# Patient Record
Sex: Female | Born: 1950 | Race: White | Hispanic: No | Marital: Married | Smoking: Never smoker
Health system: Southern US, Community
[De-identification: ages and names within clinical notes are randomized; demographics above are authoritative.]

## PROBLEM LIST (undated history)

## (undated) DIAGNOSIS — H18599 Other hereditary corneal dystrophies, unspecified eye: Secondary | ICD-10-CM

## (undated) DIAGNOSIS — R109 Unspecified abdominal pain: Secondary | ICD-10-CM

## (undated) DIAGNOSIS — M797 Fibromyalgia: Secondary | ICD-10-CM

## (undated) DIAGNOSIS — I10 Essential (primary) hypertension: Secondary | ICD-10-CM

## (undated) DIAGNOSIS — H1859 Other hereditary corneal dystrophies: Secondary | ICD-10-CM

## (undated) DIAGNOSIS — K219 Gastro-esophageal reflux disease without esophagitis: Secondary | ICD-10-CM

## (undated) HISTORY — PX: CHOLECYSTECTOMY: SHX55

---

## 2007-08-04 ENCOUNTER — Encounter: Admission: RE | Admit: 2007-08-04 | Discharge: 2007-08-04 | Payer: Self-pay | Admitting: Family Medicine

## 2008-08-04 ENCOUNTER — Ambulatory Visit (HOSPITAL_BASED_OUTPATIENT_CLINIC_OR_DEPARTMENT_OTHER): Admission: RE | Admit: 2008-08-04 | Discharge: 2008-08-04 | Payer: Self-pay | Admitting: Internal Medicine

## 2009-08-05 ENCOUNTER — Ambulatory Visit: Payer: Self-pay | Admitting: Diagnostic Radiology

## 2009-08-05 ENCOUNTER — Ambulatory Visit (HOSPITAL_BASED_OUTPATIENT_CLINIC_OR_DEPARTMENT_OTHER): Admission: RE | Admit: 2009-08-05 | Discharge: 2009-08-05 | Payer: Self-pay | Admitting: Family Medicine

## 2010-09-13 ENCOUNTER — Ambulatory Visit (HOSPITAL_BASED_OUTPATIENT_CLINIC_OR_DEPARTMENT_OTHER)
Admission: RE | Admit: 2010-09-13 | Discharge: 2010-09-13 | Payer: Self-pay | Source: Home / Self Care | Attending: Family Medicine | Admitting: Family Medicine

## 2011-08-09 ENCOUNTER — Other Ambulatory Visit (HOSPITAL_BASED_OUTPATIENT_CLINIC_OR_DEPARTMENT_OTHER): Payer: Self-pay | Admitting: *Deleted

## 2011-08-09 ENCOUNTER — Other Ambulatory Visit (HOSPITAL_BASED_OUTPATIENT_CLINIC_OR_DEPARTMENT_OTHER): Payer: Self-pay | Admitting: Internal Medicine

## 2011-08-09 DIAGNOSIS — Z1231 Encounter for screening mammogram for malignant neoplasm of breast: Secondary | ICD-10-CM

## 2011-09-19 ENCOUNTER — Ambulatory Visit (HOSPITAL_BASED_OUTPATIENT_CLINIC_OR_DEPARTMENT_OTHER)
Admission: RE | Admit: 2011-09-19 | Discharge: 2011-09-19 | Disposition: A | Discharge status: Home / Self Care | Payer: BC Managed Care – PPO | Source: Ambulatory Visit | Attending: Internal Medicine | Admitting: Internal Medicine

## 2011-09-19 ENCOUNTER — Ambulatory Visit (HOSPITAL_BASED_OUTPATIENT_CLINIC_OR_DEPARTMENT_OTHER): Payer: Self-pay

## 2011-09-19 DIAGNOSIS — Z1231 Encounter for screening mammogram for malignant neoplasm of breast: Secondary | ICD-10-CM | POA: Insufficient documentation

## 2012-07-19 ENCOUNTER — Emergency Department (HOSPITAL_BASED_OUTPATIENT_CLINIC_OR_DEPARTMENT_OTHER): Payer: Managed Care, Other (non HMO)

## 2012-07-19 ENCOUNTER — Emergency Department (HOSPITAL_BASED_OUTPATIENT_CLINIC_OR_DEPARTMENT_OTHER)
Admission: EM | Admit: 2012-07-19 | Discharge: 2012-07-19 | Disposition: A | Discharge status: Home / Self Care | Payer: Managed Care, Other (non HMO) | Attending: Emergency Medicine | Admitting: Emergency Medicine

## 2012-07-19 ENCOUNTER — Encounter (HOSPITAL_BASED_OUTPATIENT_CLINIC_OR_DEPARTMENT_OTHER): Payer: Self-pay | Admitting: *Deleted

## 2012-07-19 DIAGNOSIS — M542 Cervicalgia: Secondary | ICD-10-CM | POA: Insufficient documentation

## 2012-07-19 DIAGNOSIS — W19XXXA Unspecified fall, initial encounter: Secondary | ICD-10-CM

## 2012-07-19 DIAGNOSIS — I10 Essential (primary) hypertension: Secondary | ICD-10-CM | POA: Insufficient documentation

## 2012-07-19 DIAGNOSIS — R110 Nausea: Secondary | ICD-10-CM | POA: Insufficient documentation

## 2012-07-19 DIAGNOSIS — W010XXA Fall on same level from slipping, tripping and stumbling without subsequent striking against object, initial encounter: Secondary | ICD-10-CM | POA: Insufficient documentation

## 2012-07-19 DIAGNOSIS — T1490XA Injury, unspecified, initial encounter: Secondary | ICD-10-CM | POA: Insufficient documentation

## 2012-07-19 DIAGNOSIS — R209 Unspecified disturbances of skin sensation: Secondary | ICD-10-CM | POA: Insufficient documentation

## 2012-07-19 DIAGNOSIS — M2550 Pain in unspecified joint: Secondary | ICD-10-CM | POA: Insufficient documentation

## 2012-07-19 HISTORY — DX: Essential (primary) hypertension: I10

## 2012-07-19 IMAGING — CR DG TIBIA/FIBULA 2V*R*
4 series · 4 of 4 positions shown · non-contrast
Comparison: None.

CLINICAL DATA: Fall

RIGHT TIBIA AND FIBULA - 2 VIEW

[t tib/fib ap right (1 of 2)]
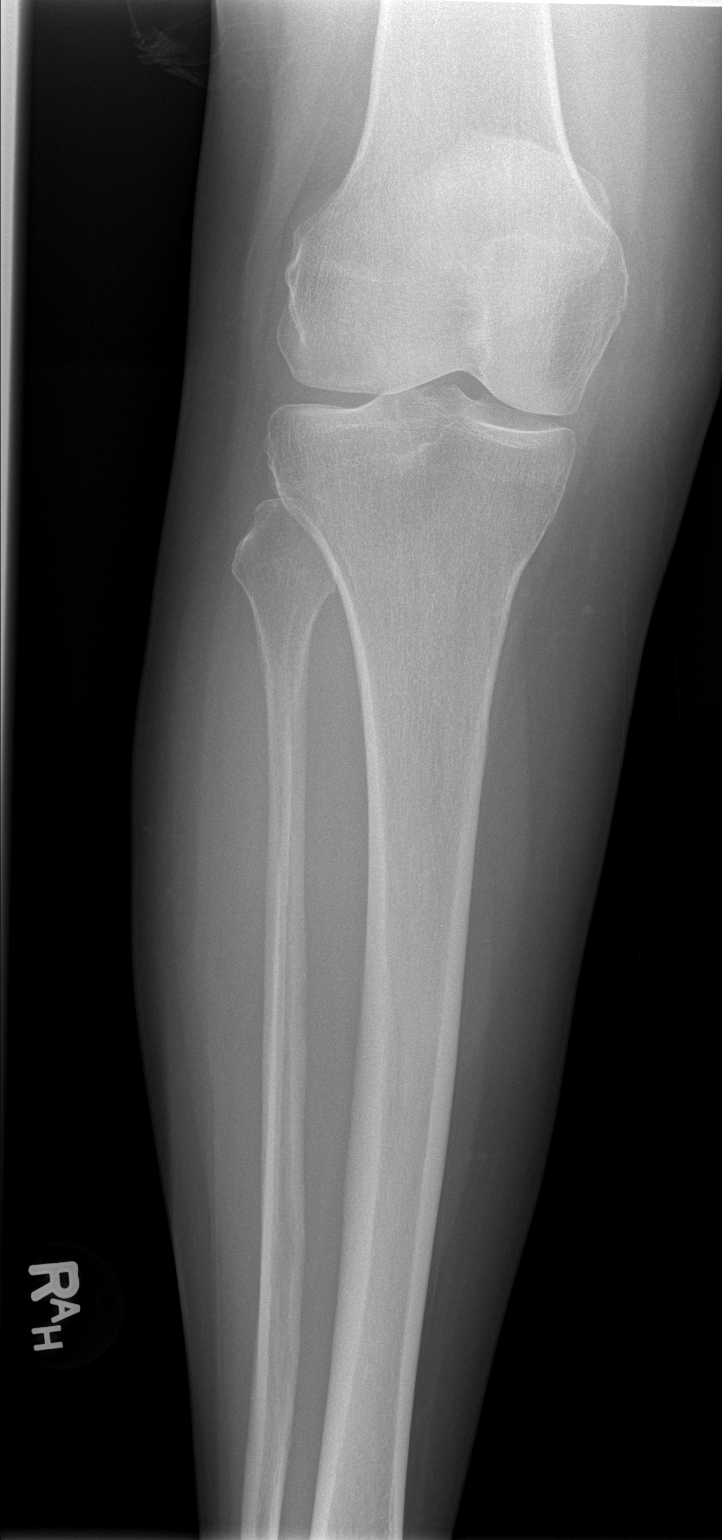

[t tib/fib ap right (2 of 2)]
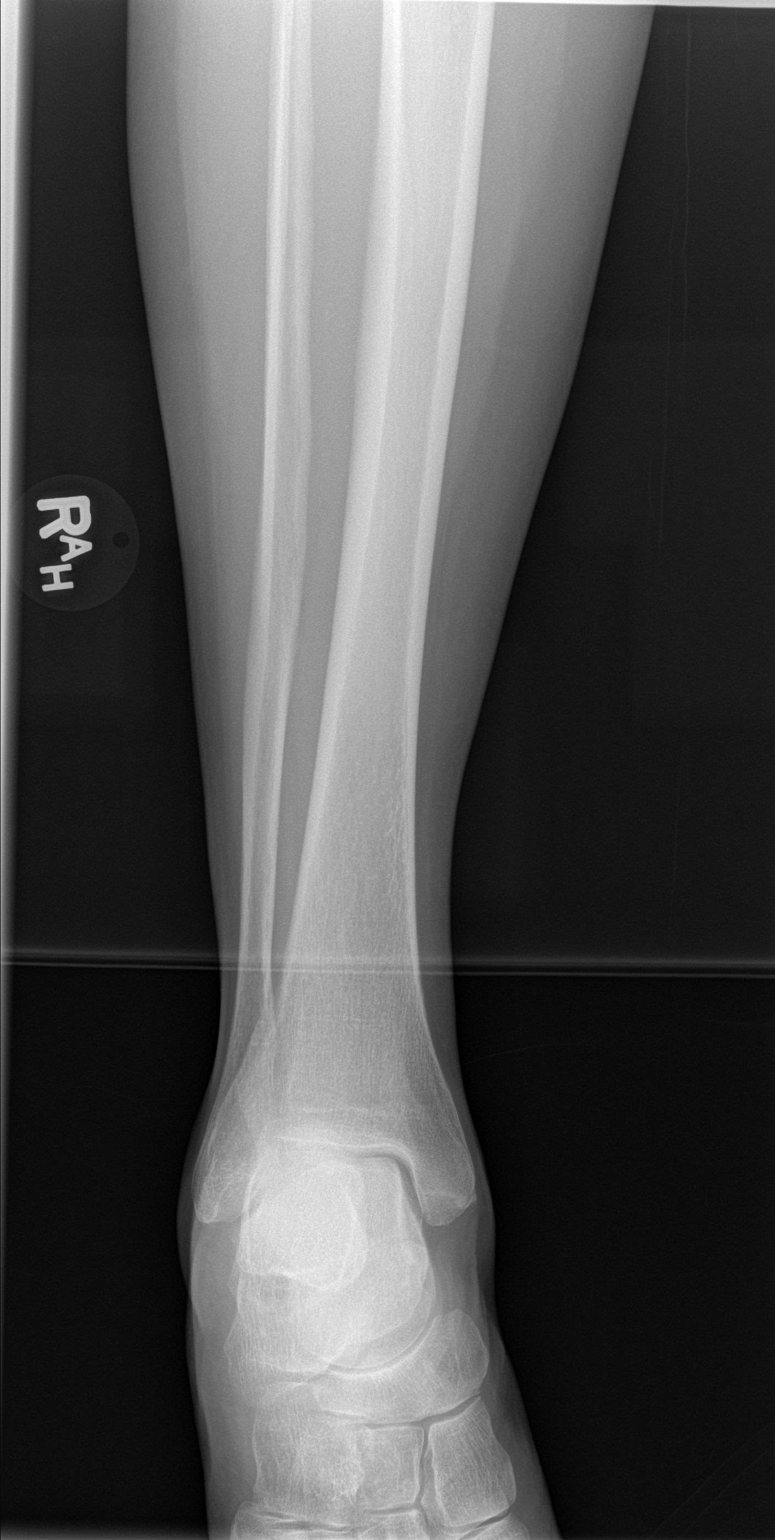

[t tib/fib lat right (1 of 2)]
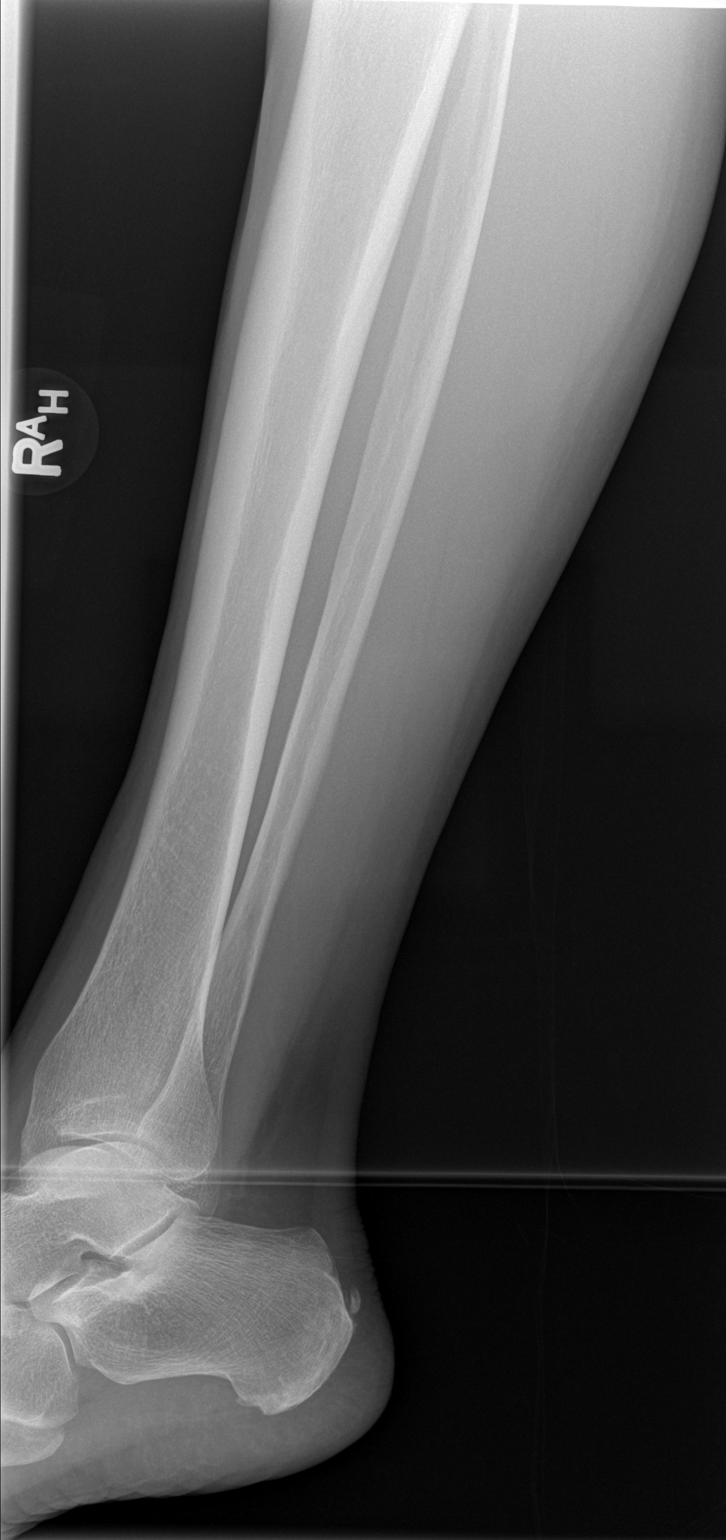

[t tib/fib lat right (2 of 2)]
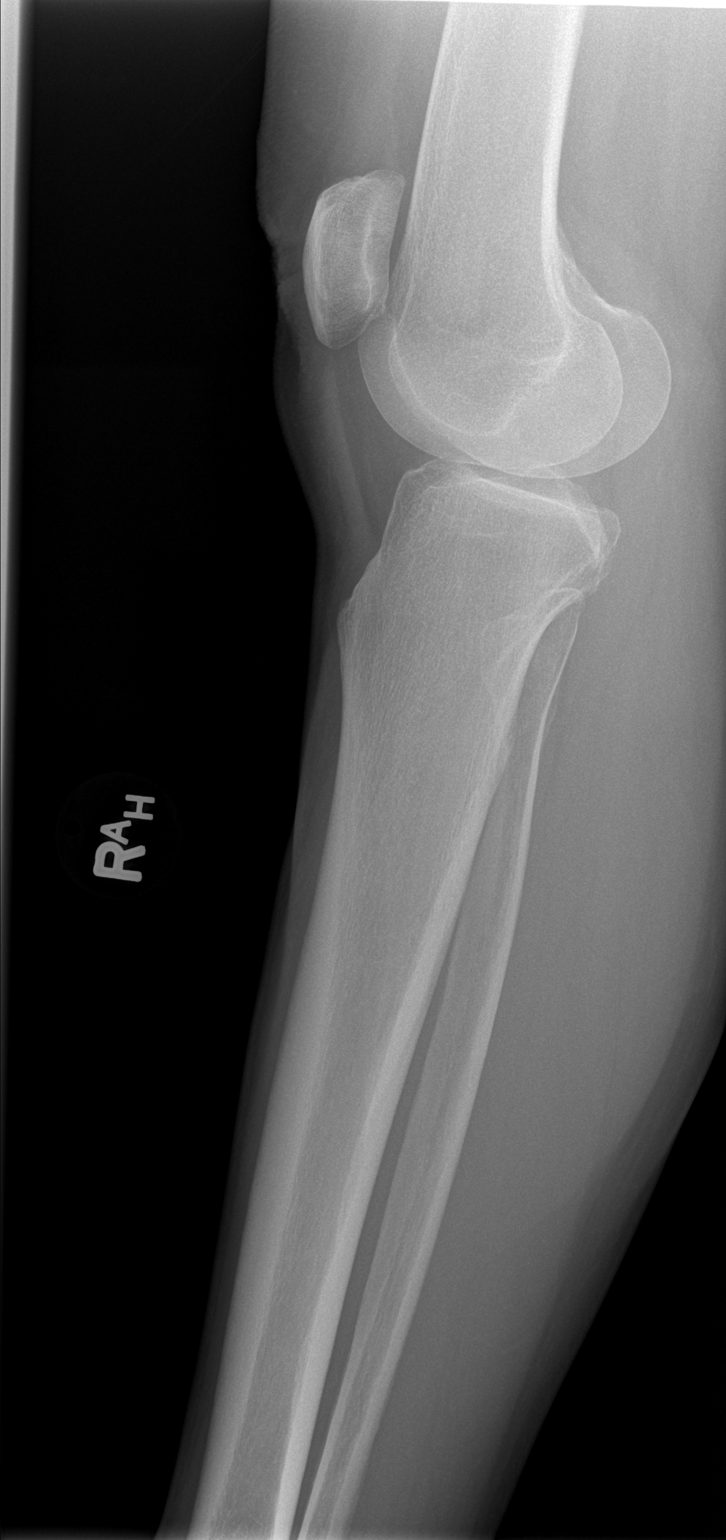

[4 of 4 positions shown; findings below may reference images not displayed]

FINDINGS: No fracture or dislocation is seen.

The joint spaces are preserved.

Mild soft tissue irregularity overlying the patella.
IMPRESSION: No fracture or dislocation is seen.

## 2012-07-19 MED ORDER — IBUPROFEN 800 MG PO TABS
800.0000 mg | ORAL_TABLET | Freq: Once | ORAL | Status: AC
  Administered 2012-07-19: 800 mg via ORAL
  Filled 2012-07-19: qty 1

## 2012-07-19 NOTE — ED Provider Notes (Signed)
History     CSN: [id number]  Arrival date & time 07/19/12  8657   First MD Initiated Contact with Patient 07/19/12 2119      Chief Complaint  Patient presents with  . Fall    (Consider location/radiation/quality/duration/timing/severity/associated sxs/prior treatment) HPI Comments: 61 y/o female presents to the ED complaining of "pain in all of her joints" s/[place] [employer] around 7:30 pm tonight. States [place] when she [place] on something and fell, "braced herself as hard as possible with wrists, elbows, knees in order to avoid hitting head". Denies hitting her head or LOC. States "she really took a fall". Currently complaining of shoulder, elbow, wrist, knee and ankle pain. States she has tingling in her fingertips of 3rd digit bilaterally. Admits to nausea directly after the fall because "it hit so fast". Currently not nauseated. She has not tried any alleviating factors for her pain since the drug store was closed, so her husband decided to take her to the ED since "she never complains of pain".   Patient is a 61 y.o. female presenting with fall. The history is provided by the patient.  Fall Associated symptoms include numbness and nausea. Pertinent negatives include no vomiting.    Past Medical History  Diagnosis Date  . Hypertension     Past Surgical History  Procedure Date  . Cholecystectomy     History reviewed. No pertinent family history.  History  Substance Use Topics  . Smoking status: Never Smoker   . Smokeless tobacco: Not on file  . Alcohol Use: No    OB History    Grav Para Term Preterm Abortions TAB SAB Ect Mult Living                  Review of Systems  Constitutional: Negative for activity change.  HENT: Positive for neck pain. Negative for neck stiffness.   Eyes: Negative for visual disturbance.  Respiratory: Negative for shortness of breath.   Cardiovascular: Negative for chest pain.  Gastrointestinal: Positive for  nausea. Negative for vomiting.  Musculoskeletal: Positive for arthralgias.  Skin: Negative for color change and wound.  Neurological: Positive for numbness.  Psychiatric/Behavioral: Negative for confusion.    Allergies  Abilify; Penicillins; and Sudafed  Home Medications   Current Outpatient Rx  Name Route Sig Dispense Refill  . CLORAZEPATE DIPOTASSIUM 3.75 MG PO TABS Oral Take 3.75 mg by mouth daily.    Marland Kitchen FLUOXETINE HCL 10 MG PO TABS Oral Take 10 mg by mouth daily.    . NEBIVOLOL HCL 5 MG PO TABS Oral Take 5 mg by mouth daily.      BP 127/73  Pulse 62  Temp 97.9 F (36.6 C) (Oral)  Resp 20  Ht 5\' 7"  (1.702 m)  Wt 170 lb (77.111 kg)  BMI 26.63 kg/m2  SpO2 97%  Physical Exam  Constitutional: She is oriented to person, place, and time. She appears well-developed and well-nourished. No distress.  HENT:  Head: Normocephalic and atraumatic.  Eyes: Conjunctivae normal and EOM are normal. Pupils are equal, round, and reactive to light.  Neck: Normal range of motion. Neck supple.  Cardiovascular: Normal rate, regular rhythm, normal heart sounds and intact distal pulses.   Pulmonary/Chest: Effort normal and breath sounds normal.  Abdominal: Soft. Bowel sounds are normal. There is no tenderness.  Musculoskeletal:       Right shoulder: She exhibits tenderness (generalized througout entire shoulder girdle). She exhibits normal range of motion, no bony  tenderness, no swelling, normal pulse and normal strength.       Left shoulder: She exhibits tenderness (generalized throughout entire shoulder girdle). She exhibits normal range of motion, no bony tenderness, no swelling, no deformity, normal pulse and normal strength.       Right elbow: She exhibits normal range of motion, no swelling and no deformity. tenderness found. Olecranon process tenderness noted.       Left elbow: She exhibits normal range of motion, no swelling and no deformity. tenderness found. Olecranon process tenderness  noted.       Right wrist: She exhibits tenderness (mild over carpal bones, no snuffbox tenderness). She exhibits normal range of motion and no swelling.       Left wrist: She exhibits tenderness (mild over carpal bones, no snuffbox tenderness). She exhibits normal range of motion, no swelling and no deformity.       Right knee: Normal.       Left knee: She exhibits swelling (mild over lateral joint line) and bony tenderness (over patella). She exhibits normal range of motion and no ecchymosis. tenderness found. Lateral joint line tenderness noted.       Right ankle: Normal.       Left ankle: Normal.       Cervical back: She exhibits tenderness (bilateral paraspinal muscles and trapezius). She exhibits normal range of motion, no bony tenderness, no edema, no spasm and normal pulse.       Thoracic back: Normal.       Lumbar back: Normal.       Right upper arm: Normal.       Left upper arm: Normal.       Right forearm: Normal.       Left forearm: Normal.       Right hand: Normal.       Left hand: Normal.       Right upper leg: Normal.       Left upper leg: Normal.       Right lower leg: Normal.       Left lower leg: Normal.  Neurological: She is alert and oriented to person, place, and time. She has normal strength. No sensory deficit.  Skin: Skin is warm and dry. No bruising and no ecchymosis noted.  Psychiatric: She has a normal mood and affect. Her speech is normal and behavior is normal.    ED Course  Procedures (including critical care time)  Labs Reviewed - No data to display Dg Chest 1 View  07/19/2012  *RADIOLOGY REPORT*  Clinical Data: Fall  CHEST - 1 VIEW  Comparison: 02/04/2006  Findings: Lungs are clear. No pleural effusion or pneumothorax.  Cardiomediastinal silhouette is within normal limits.  Bilateral breast prostheses.  Degenerative changes the bilateral midclavicular joints.  IMPRESSION: No evidence of acute cardiopulmonary disease.   Original Report Authenticated By:  Charline Bills, M.D.    Dg Elbow Complete Left  07/19/2012  *RADIOLOGY REPORT*  Clinical Data: Fall  LEFT ELBOW - COMPLETE 3+ VIEW  Comparison: None.  Findings: No fracture or dislocation is seen.  The joint spaces are preserved.  The visualized soft tissues are unremarkable.  No displaced elbow joint fat pads to suggest an elbow joint effusion.  IMPRESSION: No fracture or dislocation is seen.   Original Report Authenticated By: Charline Bills, M.D.    Dg Forearm Left  07/19/2012  *RADIOLOGY REPORT*  Clinical Data: Fall  LEFT FOREARM - 2 VIEW  Comparison: None.  Findings: No fracture or  dislocation is seen.  The joint spaces are preserved.  The visualized soft tissues are unremarkable.  No displaced elbow joint fat pads to suggest an elbow joint effusion.  IMPRESSION: No fracture or dislocation is seen.   Original Report Authenticated By: Charline Bills, M.D.    Dg Forearm Right  07/19/2012  *RADIOLOGY REPORT*  Clinical Data: Fall.  RIGHT FOREARM - 2 VIEW  Comparison: None.  Findings: No fracture or dislocation is seen.  Very mild degenerative changes of the elbow joint.  The visualized soft tissues are unremarkable.  No displaced elbow joint fat pads to suggest elbow joint effusion.  IMPRESSION: No fracture or dislocation is seen.   Original Report Authenticated By: Charline Bills, M.D.    Dg Wrist Complete Left  07/19/2012  *RADIOLOGY REPORT*  Clinical Data: Fall  LEFT WRIST - COMPLETE 3+ VIEW  Comparison: None.  Findings: No fracture or dislocation is seen.  The joint spaces are preserved.  Faint calcifications in the region of the triangular fibrocartilage complex.  The visualized soft tissues are unremarkable.  IMPRESSION: No fracture or dislocation is seen.   Original Report Authenticated By: Charline Bills, M.D.    Dg Wrist Complete Right  07/19/2012  *RADIOLOGY REPORT*  Clinical Data: Fall  RIGHT WRIST - COMPLETE 3+ VIEW  Comparison: None.  Findings: No fracture or dislocation  is seen.  Degenerative changes of the first carpometacarpal joint.  The joint spaces are otherwise preserved.  The visualized soft tissues are unremarkable.  IMPRESSION: No fracture or dislocation is seen.   Original Report Authenticated By: Charline Bills, M.D.    Dg Tibia/fibula Left  07/19/2012  *RADIOLOGY REPORT*  Clinical Data: Fall  LEFT TIBIA AND FIBULA - 2 VIEW  Comparison: None.  Findings: No fracture or dislocation is seen.  The joint spaces are preserved.  The visualized soft tissues are unremarkable.  IMPRESSION: No fracture or dislocation is seen.   Original Report Authenticated By: Charline Bills, M.D.    Dg Tibia/fibula Right  07/19/2012  *RADIOLOGY REPORT*  Clinical Data: Fall  RIGHT TIBIA AND FIBULA - 2 VIEW  Comparison: None.  Findings: No fracture or dislocation is seen.  The joint spaces are preserved.  Mild soft tissue irregularity overlying the patella.  IMPRESSION: No fracture or dislocation is seen.   Original Report Authenticated By: Charline Bills, M.D.    Dg Knee Ap/lat W/sunrise Right  07/19/2012  *RADIOLOGY REPORT*  Clinical Data: Fall  DG KNEE - 3 VIEWS  Comparison: None.  Findings: No fracture or dislocation is seen.  Joint spaces are essentially preserved.  Possible mild prepatellar soft tissue swelling/irregularity.  No suprapatellar knee joint effusion.  IMPRESSION: No fracture or dislocation is seen.  Possible mild prepatellar soft tissue swelling/irregularity.   Original Report Authenticated By: Charline Bills, M.D.      1. Multiple joint pain   2. Fall       MDM  61 y/o female with multiple joint pain complaints s/p fall at [employer] earlier tonight. Patient and husband were insistent she needed x-rays of all her painful areas. i explained in detail while examining patient why I would or would not obtain x-rays of that area which wife understood. After speaking with Dr. Radford Pax, he advised just to x-ray everything she complained. All x-rays normal. She  states she knows she "definitely has torn muscles" so if she is not better in a few days will "order an MRI through her PCP". Pain improved with ibuprofen. Discussed conservative measures.  Trevor Mace, PA-C 07/19/12 2310

## 2012-07-19 NOTE — ED Notes (Signed)
Pt states she slipped on a wet floor and is now c/o pain all over.

## 2012-07-20 NOTE — ED Provider Notes (Signed)
Medical screening examination/treatment/procedure(s) were performed by non-physician practitioner and as supervising physician I was immediately available for consultation/collaboration.    Nelia Shi, MD 07/20/12 1116

## 2012-09-01 ENCOUNTER — Other Ambulatory Visit (HOSPITAL_BASED_OUTPATIENT_CLINIC_OR_DEPARTMENT_OTHER): Payer: Self-pay | Admitting: Radiology

## 2012-09-01 ENCOUNTER — Other Ambulatory Visit (HOSPITAL_BASED_OUTPATIENT_CLINIC_OR_DEPARTMENT_OTHER): Payer: Self-pay | Admitting: *Deleted

## 2012-09-01 DIAGNOSIS — Z1231 Encounter for screening mammogram for malignant neoplasm of breast: Secondary | ICD-10-CM

## 2012-09-19 ENCOUNTER — Inpatient Hospital Stay (HOSPITAL_BASED_OUTPATIENT_CLINIC_OR_DEPARTMENT_OTHER): Admission: RE | Admit: 2012-09-19 | Payer: BC Managed Care – PPO | Source: Ambulatory Visit

## 2012-09-23 ENCOUNTER — Ambulatory Visit (HOSPITAL_BASED_OUTPATIENT_CLINIC_OR_DEPARTMENT_OTHER): Payer: BC Managed Care – PPO

## 2012-09-26 ENCOUNTER — Ambulatory Visit (HOSPITAL_BASED_OUTPATIENT_CLINIC_OR_DEPARTMENT_OTHER)
Admission: RE | Admit: 2012-09-26 | Discharge: 2012-09-26 | Disposition: A | Discharge status: Home / Self Care | Payer: Managed Care, Other (non HMO) | Source: Ambulatory Visit | Attending: Internal Medicine | Admitting: Internal Medicine

## 2012-09-26 DIAGNOSIS — Z1231 Encounter for screening mammogram for malignant neoplasm of breast: Secondary | ICD-10-CM | POA: Insufficient documentation

## 2013-10-12 ENCOUNTER — Other Ambulatory Visit: Payer: Self-pay

## 2013-10-12 ENCOUNTER — Other Ambulatory Visit (HOSPITAL_BASED_OUTPATIENT_CLINIC_OR_DEPARTMENT_OTHER): Payer: Self-pay | Admitting: *Deleted

## 2013-10-12 DIAGNOSIS — Z1231 Encounter for screening mammogram for malignant neoplasm of breast: Secondary | ICD-10-CM

## 2013-10-14 ENCOUNTER — Ambulatory Visit (HOSPITAL_BASED_OUTPATIENT_CLINIC_OR_DEPARTMENT_OTHER): Payer: Managed Care, Other (non HMO)

## 2013-10-21 ENCOUNTER — Inpatient Hospital Stay (HOSPITAL_BASED_OUTPATIENT_CLINIC_OR_DEPARTMENT_OTHER): Admission: RE | Admit: 2013-10-21 | Payer: Managed Care, Other (non HMO) | Source: Ambulatory Visit

## 2014-03-26 ENCOUNTER — Other Ambulatory Visit (HOSPITAL_BASED_OUTPATIENT_CLINIC_OR_DEPARTMENT_OTHER): Payer: Self-pay | Admitting: Family Medicine

## 2014-03-26 DIAGNOSIS — Z1231 Encounter for screening mammogram for malignant neoplasm of breast: Secondary | ICD-10-CM

## 2014-03-29 ENCOUNTER — Inpatient Hospital Stay (HOSPITAL_BASED_OUTPATIENT_CLINIC_OR_DEPARTMENT_OTHER): Admission: RE | Admit: 2014-03-29 | Payer: Managed Care, Other (non HMO) | Source: Ambulatory Visit

## 2016-05-11 ENCOUNTER — Emergency Department (HOSPITAL_BASED_OUTPATIENT_CLINIC_OR_DEPARTMENT_OTHER)
Admission: EM | Admit: 2016-05-11 | Discharge: 2016-05-11 | Disposition: A | Discharge status: Home / Self Care | Payer: Managed Care, Other (non HMO) | Attending: Emergency Medicine | Admitting: Emergency Medicine

## 2016-05-11 ENCOUNTER — Encounter (HOSPITAL_BASED_OUTPATIENT_CLINIC_OR_DEPARTMENT_OTHER): Payer: Self-pay | Admitting: *Deleted

## 2016-05-11 DIAGNOSIS — R1010 Upper abdominal pain, unspecified: Secondary | ICD-10-CM | POA: Diagnosis present

## 2016-05-11 DIAGNOSIS — I10 Essential (primary) hypertension: Secondary | ICD-10-CM | POA: Diagnosis not present

## 2016-05-11 DIAGNOSIS — R112 Nausea with vomiting, unspecified: Secondary | ICD-10-CM | POA: Insufficient documentation

## 2016-05-11 DIAGNOSIS — R197 Diarrhea, unspecified: Secondary | ICD-10-CM | POA: Diagnosis not present

## 2016-05-11 LAB — CBC WITH DIFFERENTIAL/PLATELET
BASOS ABS: 0 10*3/uL (ref 0.0–0.1)
Basophils Relative: 0 %
EOS PCT: 1 %
Eosinophils Absolute: 0.1 10*3/uL (ref 0.0–0.7)
HEMATOCRIT: 39.9 % (ref 36.0–46.0)
Hemoglobin: 13.6 g/dL (ref 12.0–15.0)
LYMPHS ABS: 0.9 10*3/uL (ref 0.7–4.0)
LYMPHS PCT: 7 %
MCH: 30.3 pg (ref 26.0–34.0)
MCHC: 34.1 g/dL (ref 30.0–36.0)
MCV: 88.9 fL (ref 78.0–100.0)
MONO ABS: 0.7 10*3/uL (ref 0.1–1.0)
MONOS PCT: 6 %
NEUTROS ABS: 11.2 10*3/uL — AB (ref 1.7–7.7)
Neutrophils Relative %: 86 %
Platelets: 178 10*3/uL (ref 150–400)
RBC: 4.49 MIL/uL (ref 3.87–5.11)
RDW: 12.8 % (ref 11.5–15.5)
WBC: 12.9 10*3/uL — ABNORMAL HIGH (ref 4.0–10.5)

## 2016-05-11 LAB — BASIC METABOLIC PANEL
ANION GAP: 8 (ref 5–15)
BUN: 13 mg/dL (ref 6–20)
CALCIUM: 9.4 mg/dL (ref 8.9–10.3)
CO2: 25 mmol/L (ref 22–32)
Chloride: 105 mmol/L (ref 101–111)
Creatinine, Ser: 0.88 mg/dL (ref 0.44–1.00)
GFR calc Af Amer: >60 mL/min (ref >60)
GFR calc non Af Amer: >60 mL/min (ref >60)
GLUCOSE: 114 mg/dL — AB (ref 65–99)
Potassium: 3.1 mmol/L — ABNORMAL LOW (ref 3.5–5.1)
Sodium: 138 mmol/L (ref 135–145)

## 2016-05-11 LAB — HEPATIC FUNCTION PANEL
ALT: 17 U/L (ref 14–54)
AST: 29 U/L (ref 15–41)
Albumin: 4.4 g/dL (ref 3.5–5.0)
Alkaline Phosphatase: 50 U/L (ref 38–126)
BILIRUBIN DIRECT: 0.1 mg/dL (ref 0.1–0.5)
BILIRUBIN INDIRECT: 0.7 mg/dL (ref 0.3–0.9)
Total Bilirubin: 0.8 mg/dL (ref 0.3–1.2)
Total Protein: 7.7 g/dL (ref 6.5–8.1)

## 2016-05-11 LAB — URINE MICROSCOPIC-ADD ON

## 2016-05-11 LAB — URINALYSIS, ROUTINE W REFLEX MICROSCOPIC
Bilirubin Urine: NEGATIVE
GLUCOSE, UA: NEGATIVE mg/dL
KETONES UR: NEGATIVE mg/dL
LEUKOCYTES UA: NEGATIVE
Nitrite: NEGATIVE
PH: 6 (ref 5.0–8.0)
Protein, ur: NEGATIVE mg/dL
Specific Gravity, Urine: 1.022 (ref 1.005–1.030)

## 2016-05-11 LAB — LIPASE, BLOOD: Lipase: 33 U/L (ref 11–51)

## 2016-05-11 MED ORDER — ONDANSETRON HCL 4 MG PO TABS: 4.0000 mg | ORAL_TABLET | Freq: Four times a day (QID) | ORAL | 0 refills | Status: AC

## 2016-05-11 MED ORDER — ONDANSETRON HCL 4 MG/2ML IJ SOLN
4.0000 mg | Freq: Once | INTRAMUSCULAR | Status: AC
  Administered 2016-05-11: 4 mg via INTRAVENOUS
  Filled 2016-05-11: qty 2

## 2016-05-11 MED ORDER — POTASSIUM CHLORIDE CRYS ER 20 MEQ PO TBCR: 30.0000 meq | EXTENDED_RELEASE_TABLET | Freq: Once | ORAL | Status: DC

## 2016-05-11 MED ORDER — POTASSIUM CHLORIDE CRYS ER 20 MEQ PO TBCR
EXTENDED_RELEASE_TABLET | ORAL | Status: AC
  Filled 2016-05-11: qty 2

## 2016-05-11 MED ORDER — LOPERAMIDE HCL 2 MG PO CAPS: 2.0000 mg | ORAL_CAPSULE | Freq: Four times a day (QID) | ORAL | 0 refills | Status: AC | PRN

## 2016-05-11 MED ORDER — POTASSIUM CHLORIDE CRYS ER 20 MEQ PO TBCR
40.0000 meq | EXTENDED_RELEASE_TABLET | Freq: Once | ORAL | Status: AC
  Administered 2016-05-11: 40 meq via ORAL

## 2016-05-11 MED ORDER — SODIUM CHLORIDE 0.9 % IV BOLUS (SEPSIS)
1000.0000 mL | Freq: Once | INTRAVENOUS | Status: AC
  Administered 2016-05-11: 1000 mL via INTRAVENOUS

## 2016-05-11 NOTE — Discharge Instructions (Signed)
Return for worsening symptoms, including fever, severe abdominal pain, intractable vomiting or any other symptoms concerning to you.

## 2016-05-11 NOTE — ED Triage Notes (Signed)
Pt c/o n/v abd pain onset midnight,  States had taken tums and zantac around 2330 last pm for acid reflux  States vomited x 2 has had phenergan supp 25 mg

## 2016-05-11 NOTE — ED Provider Notes (Signed)
[facility] Provider Note   CSN: [id number] Arrival date & time: 05/11/16  4098  First Provider Contact:  First MD Initiated Contact with Patient 05/11/16 [phone]        History   Chief Complaint Chief Complaint  Patient presents with  . Abdominal Pain    HPI Jacqueline Morales is a 65 y.o. female.  HPI History of choleycystectomy and GERD. Reports onset of upper abdominal pain with nausea vomiting diarrhea starting after midnight today.  She has been in her usual state of health.  Her husband has a malignancy and undergoes treatment of the cancer center, which she accompanied him to today.  Had veggie burger in the cafeteria.  Later on this evening had a salad for dinner.  Around midnight and eat a half a cup of Cheerios and then subsequently had abdominal cramping with 2 episodes of nonbloody nonbilious emesis and 2 episodes of diarrhea.  She occurred Zantac, Tums and her husband by mouth and suppository Phenergan without significant relief.  No fevers or chills.  No chest pain or difficulty breathing.  No new urinary complaints but states at baseline she does have some dysuria.  Diarrhea is nonbloody.  No recent antibiotics, recent travel, or known sick contacts that she has been in the hospital recently with her husband who is receiving cancer treatment.   Past Medical History:  Diagnosis Date  . Hypertension     There are no active problems to display for this patient.   Past Surgical History:  Procedure Laterality Date  . CHOLECYSTECTOMY      OB History    No data available       Home Medications    Prior to Admission medications   Medication Sig Start Date End Date Taking? Authorizing Provider  clorazepate (TRANXENE) 3.75 MG tablet Take 3.75 mg by mouth daily.    Historical Provider, MD  FLUoxetine (PROZAC) 10 MG tablet Take 10 mg by mouth daily.    Historical Provider, MD  loperamide (IMODIUM) 2 MG capsule Take 1 capsule (2 mg total) by mouth 4 (four)  times daily as needed for diarrhea or loose stools. 05/11/16   Lavera Guise, MD  nebivolol (BYSTOLIC) 5 MG tablet Take 5 mg by mouth daily.    Historical Provider, MD  ondansetron (ZOFRAN) 4 MG tablet Take 1 tablet (4 mg total) by mouth every 6 (six) hours. 05/11/16   Lavera Guise, MD    Family History No family history on file.  Social History Social History  Substance Use Topics  . Smoking status: Never Smoker  . Smokeless tobacco: Never Used  . Alcohol use No     Allergies   Abilify [aripiprazole]; Penicillins; and Sudafed [pseudoephedrine hcl]   Review of Systems Review of Systems 10/14 systems reviewed and are negative other than those stated in the HPI   Physical Exam Updated Vital Signs BP 150/83 (BP Location: Right Arm)   Pulse 78   Temp 97.9 F (36.6 C) (Oral)   Ht  (1.676 m)   Wt 157 lb (71.2 kg)   SpO2 98%   BMI 25.34 kg/m   Physical Exam Physical Exam  Nursing note and vitals reviewed. Constitutional: Well developed, well nourished, non-toxic, and in no acute distress Head: Normocephalic and atraumatic.  Mouth/Throat: Oropharynx is clear and dry mucous membranes.  Neck: Normal range of motion. Neck supple.  Cardiovascular: Normal rate and regular rhythm.   Pulmonary/Chest: Effort normal and breath sounds normal.  Abdominal: Soft.  There is no tenderness. There is no rebound and no guarding.  Musculoskeletal: Normal range of motion.  Neurological: Alert, no facial droop, fluent speech, moves all extremities symmetrically Skin: Skin is warm and dry.  Psychiatric: Cooperative   ED Treatments / Results  Labs (all labs ordered are listed, but only abnormal results are displayed) Labs Reviewed  CBC WITH DIFFERENTIAL/PLATELET - Abnormal; Notable for the following:       Result Value   WBC 12.9 (*)    Neutro Abs 11.2 (*)    All other components within normal limits  BASIC METABOLIC PANEL - Abnormal; Notable for the following:    Potassium 3.1 (*)     Glucose, Bld 114 (*)    All other components within normal limits  URINALYSIS, ROUTINE W REFLEX MICROSCOPIC (NOT AT [facility]) - Abnormal; Notable for the following:    Hgb urine dipstick SMALL (*)    All other components within normal limits  URINE MICROSCOPIC-ADD ON - Abnormal; Notable for the following:    Squamous Epithelial / LPF 0-5 (*)    Bacteria, UA FEW (*)    Casts HYALINE CASTS (*)    All other components within normal limits  LIPASE, BLOOD  HEPATIC FUNCTION PANEL    EKG  EKG Interpretation None       Radiology No results found.  Procedures Procedures (including critical care time)  Medications Ordered in ED Medications  ondansetron (ZOFRAN) injection 4 mg (4 mg Intravenous Given 05/11/16 0348)  sodium chloride 0.9 % bolus 1,000 mL (1,000 mLs Intravenous New Bag/Given 05/11/16 0439)  potassium chloride SA (K-DUR,KLOR-CON) CR tablet 40 mEq (40 mEq Oral Given 05/11/16 0436)     Initial Impression / Assessment and Plan / ED Course  I have reviewed the triage vital signs and the nursing notes.  Pertinent labs & imaging results that were available during my care of the patient were reviewed by me and considered in my medical decision making (see chart for details).  Clinical Course  Presenting with nausea, vomiting, and diarrhea starting earlier tonight.  Is in no acute distress with normal vital signs.  Abdomen is soft and benign without significant tenderness.  Suspect either food borne or other benign GI illness.  She received IV fluids and anti-emetics with significant relief and was able to tolerate by mouth intake.  Blood work with mild hypokalemia of 3.1 and repeated with oral potassium.  Remainder of blod work overall non-concerning. The patient appears reasonably screened and/or stabilized for discharge and I doubt any other medical condition or other [facility] requiring further screening, evaluation, or treatment in the ED at this time prior to discharge. Strict return  and follow-up instructions reviewed. She expressed understanding of all discharge instructions and felt comfortable with the plan of care.   Final Clinical Impressions(s) / ED Diagnoses   Final diagnoses:  Nausea vomiting and diarrhea    New Prescriptions New Prescriptions   LOPERAMIDE (IMODIUM) 2 MG CAPSULE    Take 1 capsule (2 mg total) by mouth 4 (four) times daily as needed for diarrhea or loose stools.   ONDANSETRON (ZOFRAN) 4 MG TABLET    Take 1 tablet (4 mg total) by mouth every 6 (six) hours.     Lavera Guiseana Duo Tylesha Gibeault, MD 05/11/16 [phone]

## 2016-06-23 ENCOUNTER — Encounter (HOSPITAL_BASED_OUTPATIENT_CLINIC_OR_DEPARTMENT_OTHER): Payer: Self-pay | Admitting: *Deleted

## 2016-06-23 ENCOUNTER — Emergency Department (HOSPITAL_BASED_OUTPATIENT_CLINIC_OR_DEPARTMENT_OTHER)
Admission: EM | Admit: 2016-06-23 | Discharge: 2016-06-23 | Disposition: A | Discharge status: Home / Self Care | Payer: Managed Care, Other (non HMO) | Attending: Emergency Medicine | Admitting: Emergency Medicine

## 2016-06-23 ENCOUNTER — Emergency Department (HOSPITAL_BASED_OUTPATIENT_CLINIC_OR_DEPARTMENT_OTHER): Payer: Managed Care, Other (non HMO)

## 2016-06-23 DIAGNOSIS — R319 Hematuria, unspecified: Secondary | ICD-10-CM | POA: Diagnosis not present

## 2016-06-23 DIAGNOSIS — I10 Essential (primary) hypertension: Secondary | ICD-10-CM | POA: Insufficient documentation

## 2016-06-23 DIAGNOSIS — R1084 Generalized abdominal pain: Secondary | ICD-10-CM | POA: Diagnosis not present

## 2016-06-23 DIAGNOSIS — R112 Nausea with vomiting, unspecified: Secondary | ICD-10-CM | POA: Insufficient documentation

## 2016-06-23 DIAGNOSIS — Z79899 Other long term (current) drug therapy: Secondary | ICD-10-CM | POA: Insufficient documentation

## 2016-06-23 HISTORY — DX: Gastro-esophageal reflux disease without esophagitis: K21.9

## 2016-06-23 HISTORY — DX: Fibromyalgia: M79.7

## 2016-06-23 HISTORY — DX: Unspecified abdominal pain: R10.9

## 2016-06-23 HISTORY — DX: Other hereditary corneal dystrophies: H18.59

## 2016-06-23 HISTORY — DX: Other hereditary corneal dystrophies, unspecified eye: H18.599

## 2016-06-23 LAB — CBC WITH DIFFERENTIAL/PLATELET
BASOS PCT: 0 %
Basophils Absolute: 0 10*3/uL (ref 0.0–0.1)
EOS ABS: 0.1 10*3/uL (ref 0.0–0.7)
Eosinophils Relative: 1 %
HCT: 41.2 % (ref 36.0–46.0)
HEMOGLOBIN: 13.8 g/dL (ref 12.0–15.0)
Lymphocytes Relative: 10 %
Lymphs Abs: 1.5 10*3/uL (ref 0.7–4.0)
MCH: 29.7 pg (ref 26.0–34.0)
MCHC: 33.5 g/dL (ref 30.0–36.0)
MCV: 88.8 fL (ref 78.0–100.0)
Monocytes Absolute: 1 10*3/uL (ref 0.1–1.0)
Monocytes Relative: 7 %
NEUTROS PCT: 82 %
Neutro Abs: 12 10*3/uL — ABNORMAL HIGH (ref 1.7–7.7)
PLATELETS: 203 10*3/uL (ref 150–400)
RBC: 4.64 MIL/uL (ref 3.87–5.11)
RDW: 13.1 % (ref 11.5–15.5)
WBC: 14.6 10*3/uL — AB (ref 4.0–10.5)

## 2016-06-23 LAB — URINALYSIS, ROUTINE W REFLEX MICROSCOPIC
BILIRUBIN URINE: NEGATIVE
Glucose, UA: NEGATIVE mg/dL
Ketones, ur: NEGATIVE mg/dL
Leukocytes, UA: NEGATIVE
NITRITE: NEGATIVE
PROTEIN: NEGATIVE mg/dL
SPECIFIC GRAVITY, URINE: 1.024 (ref 1.005–1.030)
pH: 5.5 (ref 5.0–8.0)

## 2016-06-23 LAB — URINE MICROSCOPIC-ADD ON

## 2016-06-23 LAB — COMPREHENSIVE METABOLIC PANEL
ALK PHOS: 56 U/L (ref 38–126)
ALT: 14 U/L (ref 14–54)
AST: 28 U/L (ref 15–41)
Albumin: 4.5 g/dL (ref 3.5–5.0)
Anion gap: 11 (ref 5–15)
BILIRUBIN TOTAL: 0.4 mg/dL (ref 0.3–1.2)
BUN: 12 mg/dL (ref 6–20)
CALCIUM: 9.8 mg/dL (ref 8.9–10.3)
CO2: 24 mmol/L (ref 22–32)
CREATININE: 0.84 mg/dL (ref 0.44–1.00)
Chloride: 105 mmol/L (ref 101–111)
Glucose, Bld: 118 mg/dL — ABNORMAL HIGH (ref 65–99)
Potassium: 3.3 mmol/L — ABNORMAL LOW (ref 3.5–5.1)
Sodium: 140 mmol/L (ref 135–145)
Total Protein: 7.7 g/dL (ref 6.5–8.1)

## 2016-06-23 LAB — LIPASE, BLOOD: Lipase: 37 U/L (ref 11–51)

## 2016-06-23 MED ORDER — DEXTROSE 5 % IV SOLN
1.0000 g | Freq: Once | INTRAVENOUS | Status: AC
  Administered 2016-06-23: 1 g via INTRAVENOUS
  Filled 2016-06-23: qty 10

## 2016-06-23 MED ORDER — ONDANSETRON HCL 4 MG/2ML IJ SOLN
4.0000 mg | Freq: Once | INTRAMUSCULAR | Status: AC
  Administered 2016-06-23: 4 mg via INTRAVENOUS
  Filled 2016-06-23: qty 2

## 2016-06-23 MED ORDER — ONDANSETRON 4 MG PO TBDP: 4.0000 mg | ORAL_TABLET | ORAL | 0 refills | Status: AC | PRN

## 2016-06-23 MED ORDER — LORAZEPAM 2 MG/ML IJ SOLN
0.5000 mg | Freq: Once | INTRAMUSCULAR | Status: AC
  Administered 2016-06-23: 0.5 mg via INTRAVENOUS
  Filled 2016-06-23: qty 1

## 2016-06-23 MED ORDER — IOPAMIDOL (ISOVUE-300) INJECTION 61%
100.0000 mL | Freq: Once | INTRAVENOUS | Status: AC | PRN
  Administered 2016-06-23: 100 mL via INTRAVENOUS

## 2016-06-23 MED ORDER — SODIUM CHLORIDE 0.9 % IV SOLN
1000.0000 mL | INTRAVENOUS | Status: DC
  Administered 2016-06-23: 1000 mL via INTRAVENOUS

## 2016-06-23 MED ORDER — CEPHALEXIN 500 MG PO CAPS: 1000.0000 mg | ORAL_CAPSULE | Freq: Two times a day (BID) | ORAL | 0 refills | Status: AC

## 2016-06-23 MED ORDER — SODIUM CHLORIDE 0.9 % IV SOLN
1000.0000 mL | Freq: Once | INTRAVENOUS | Status: AC
  Administered 2016-06-23: 1000 mL via INTRAVENOUS

## 2016-06-23 NOTE — ED Provider Notes (Signed)
[facility] Provider Note   CSN: [id number] Arrival date & time: 06/23/16  4098     History   Chief Complaint Chief Complaint  Patient presents with  . Emesis    HPI Jacqueline Morales is a 65 y.o. female.  HPI Patient states her symptoms are the same as they were the last time she was seen here. She awakened at about 4 AM with a cramping lower abdominal pain and had a bowel movement. She states it was a normal bowel movement. She reports however she continued to have cramping abdominal discomfort that will come in waves. It made her feel sweaty and nauseated. She reports once the symptoms start, she often goes on to vomit a number of times. She reports this time however she only vomited once. She did take a Zofran and Imodium at home. Symptoms are improving. She did not have any fever or abdominal pain leading up to this. She speculates or may be a number contaminate factors. She reports she does have a lot of stress and anxiety over the past several years. Also she reports she has pretty bad reflux disease. Patient is going to see her gastroenterologist on Monday. She reports also that her eye doctor and ENT have been speculating that she may have migraines that are precipitating these symptoms. Past Medical History:  Diagnosis Date  . Abdominal spasms   . 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 Fibromyalgia   . GERD (gastroesophageal reflux disease)   . Hypertension     There are no active problems to display for this patient.   Past Surgical History:  Procedure Laterality Date  . CHOLECYSTECTOMY      OB History    No data available       Home Medications    Prior to Admission medications   Medication Sig Start Date End Date Taking? Authorizing Provider  cephALEXin (KEFLEX) 500 MG capsule Take 2 capsules (1,000 mg total) by mouth 2 (two) times daily. 06/23/16   Arby Barrette, MD  clorazepate (TRANXENE) 3.75 MG tablet Take  3.75 mg by mouth daily.    Historical Provider, MD  FLUoxetine (PROZAC) 10 MG tablet Take 10 mg by mouth daily.    Historical Provider, MD  loperamide (IMODIUM) 2 MG capsule Take 1 capsule (2 mg total) by mouth 4 (four) times daily as needed for diarrhea or loose stools. 05/11/16   Lavera Guise, MD  nebivolol (BYSTOLIC) 5 MG tablet Take 5 mg by mouth daily.    Historical Provider, MD  ondansetron (ZOFRAN ODT) 4 MG disintegrating tablet Take 1 tablet (4 mg total) by mouth every 4 (four) hours as needed for nausea or vomiting. 06/23/16   Arby Barrette, MD  ondansetron (ZOFRAN) 4 MG tablet Take 1 tablet (4 mg total) by mouth every 6 (six) hours. 05/11/16   Lavera Guise, MD    Family History No family history on file.  Social History Social History  Substance Use Topics  . Smoking status: Never Smoker  . Smokeless tobacco: Never Used  . Alcohol use No     Allergies   Abilify [aripiprazole]; Sulfur; Penicillins; and Sudafed [pseudoephedrine hcl]   Review of Systems Review of Systems 10 Systems reviewed and are negative for acute change except as noted in the HPI.  Physical Exam Updated Vital Signs BP 135/86 (BP Location: Right Arm)   Pulse 74   Temp 98.2 F (36.8 C) (Oral)   Resp 20  Ht 5\' 6"  (1.676 m)   Wt 157 lb (71.2 kg)   SpO2 100%   BMI 25.34 kg/m   Physical Exam  Constitutional: She appears well-developed and well-nourished. No distress.  HENT:  Head: Normocephalic and atraumatic.  Eyes: Conjunctivae are normal.  Neck: Neck supple.  Cardiovascular: Normal rate and regular rhythm.   No murmur heard. Pulmonary/Chest: Effort normal and breath sounds normal. No respiratory distress.  Abdominal: Soft. There is tenderness.  Mild suprapubic discomfort to palpation. No guarding or rebound.  Musculoskeletal: She exhibits no edema.  Neurological: She is alert. No cranial nerve deficit. She exhibits normal muscle tone. Coordination normal.  Skin: Skin is warm and dry.    Psychiatric: She has a normal mood and affect.  Nursing note and vitals reviewed.    ED Treatments / Results  Labs (all labs ordered are listed, but only abnormal results are displayed) Labs Reviewed  COMPREHENSIVE METABOLIC PANEL - Abnormal; Notable for the following:       Result Value   Potassium 3.3 (*)    Glucose, Bld 118 (*)    All other components within normal limits  CBC WITH DIFFERENTIAL/PLATELET - Abnormal; Notable for the following:    WBC 14.6 (*)    Neutro Abs 12.0 (*)    All other components within normal limits  URINALYSIS, ROUTINE W REFLEX MICROSCOPIC (NOT AT [facility]) - Abnormal; Notable for the following:    Hgb urine dipstick TRACE (*)    All other components within normal limits  URINE MICROSCOPIC-ADD ON - Abnormal; Notable for the following:    Squamous Epithelial / LPF 0-5 (*)    Bacteria, UA RARE (*)    Casts HYALINE CASTS (*)    All other components within normal limits  URINE CULTURE  LIPASE, BLOOD    EKG  EKG Interpretation None       Radiology Ct Abdomen Pelvis W Contrast  Result Date: 06/23/2016 CLINICAL DATA:  Abdominal pain. Leukocytosis and hematuria. Nausea and vomiting. Diarrhea. Gastroesophageal reflux disease. EXAM: CT ABDOMEN AND PELVIS WITH CONTRAST TECHNIQUE: Multidetector CT imaging of the abdomen and pelvis was performed using the standard protocol following bolus administration of intravenous contrast. CONTRAST:  ISOVUE-300 IOPAMIDOL (ISOVUE-300) INJECTION 61% COMPARISON:  Report of 02/18/2013. FINDINGS: Lower chest: Clear lung bases. Normal heart size without pericardial or pleural effusion. Bilateral breast implants. Hepatobiliary: Normal liver. Cholecystectomy, without biliary ductal dilatation. Pancreas: Normal, without mass or ductal dilatation. Spleen: Normal in size, without focal abnormality. Adrenals/Urinary Tract: Normal adrenal glands. Normal kidneys, without hydronephrosis. Normal urinary bladder. Stomach/Bowel:  Proximal gastric underdistention. Normal colon and terminal ileum. No right lower quadrant inflammation identified. The appendix may be diminutive on coronal image 34. Normal small bowel. Vascular/Lymphatic: Aortic atherosclerosis. No abdominopelvic adenopathy. Reproductive: Normal uterus and adnexa. Other: No significant free fluid. Musculoskeletal: No acute osseous abnormality. IMPRESSION: 1.  No acute process in the abdomen or pelvis. 2.  Aortic atherosclerosis. Electronically Signed   By: Jeronimo Greaves M.D.   On: 06/23/2016 12:23    Procedures Procedures (including critical care time)  Medications Ordered in ED Medications  0.9 %  sodium chloride infusion (0 mLs Intravenous Stopped 06/23/16 0927)    Followed by  0.9 %  sodium chloride infusion (1,000 mLs Intravenous New Bag/Given 06/23/16 0927)  cefTRIAXone (ROCEPHIN) 1 g in dextrose 5 % 50 mL IVPB (1 g Intravenous New Bag/Given 06/23/16 1226)  ondansetron (ZOFRAN) injection 4 mg (4 mg Intravenous Given 06/23/16 0825)  LORazepam (ATIVAN) injection 0.5 mg (  0.5 mg Intravenous Given 06/23/16 0826)  iopamidol (ISOVUE-300) 61 % injection 100 mL (100 mLs Intravenous Contrast Given 06/23/16 1210)     Initial Impression / Assessment and Plan / ED Course  I have reviewed the triage vital signs and the nursing notes.  Pertinent labs & imaging results that were available during my care of the patient were reviewed by me and considered in my medical decision making (see chart for details).  Clinical Course   Upon recheck, patient felt better after Ativan, Zofran and fluids.  Final Clinical Impressions(s) / ED Diagnoses   Final diagnoses:  Non-intractable vomiting with nausea, vomiting of unspecified type  Generalized abdominal pain  Hematuria   Patient's symptoms began fairly acutely this morning. She does have blood in the urine. CT does not show kidney stone or pyelonephritis. She does identify some frequency more recently without pain. She does  report having had problems with frequent UTI in the past. At this time, will opt to treat for suspected UTI. Cultures are pending. Patient reports she has taken Augmentin in the past without problems thus Rocephin was given and an IV dose and Keflex as prescribed. No other acute intra-abdominal pathology is identified. Patient poor she does have follow-up scheduled with her gastroenterologist. At this time I do feel patient is safe for discharge. Sings and symptoms for which to return are reviewed. New Prescriptions New Prescriptions   CEPHALEXIN (KEFLEX) 500 MG CAPSULE    Take 2 capsules (1,000 mg total) by mouth 2 (two) times daily.   ONDANSETRON (ZOFRAN ODT) 4 MG DISINTEGRATING TABLET    Take 1 tablet (4 mg total) by mouth every 4 (four) hours as needed for nausea or vomiting.     Arby BarretteMarcy Nishat Livingston, MD 06/23/16 1247

## 2016-06-23 NOTE — ED Triage Notes (Signed)
Patient states she woke up this morning at 0400 with nausea and vomiting.  Vomited x 1.  States she developed diarrhea shortly afterwards.  Diarrhea x 5.  Took Zofran and imodium with relief.  States she has a long history of severe GERD, and was seen here six weeks ago for the same.  Has recently developed some swallowing issues and will be seen next week by her GI.

## 2016-06-24 LAB — URINE CULTURE: CULTURE: NO GROWTH

## 2016-06-29 ENCOUNTER — Emergency Department (HOSPITAL_BASED_OUTPATIENT_CLINIC_OR_DEPARTMENT_OTHER): Payer: Managed Care, Other (non HMO)

## 2016-06-29 ENCOUNTER — Encounter (HOSPITAL_BASED_OUTPATIENT_CLINIC_OR_DEPARTMENT_OTHER): Payer: Self-pay | Admitting: *Deleted

## 2016-06-29 ENCOUNTER — Emergency Department (HOSPITAL_BASED_OUTPATIENT_CLINIC_OR_DEPARTMENT_OTHER)
Admission: EM | Admit: 2016-06-29 | Discharge: 2016-06-29 | Disposition: A | Discharge status: Home / Self Care | Payer: Managed Care, Other (non HMO) | Attending: Emergency Medicine | Admitting: Emergency Medicine

## 2016-06-29 DIAGNOSIS — W01198A Fall on same level from slipping, tripping and stumbling with subsequent striking against other object, initial encounter: Secondary | ICD-10-CM | POA: Insufficient documentation

## 2016-06-29 DIAGNOSIS — S60222A Contusion of left hand, initial encounter: Secondary | ICD-10-CM | POA: Insufficient documentation

## 2016-06-29 DIAGNOSIS — S6992XA Unspecified injury of left wrist, hand and finger(s), initial encounter: Secondary | ICD-10-CM | POA: Diagnosis present

## 2016-06-29 DIAGNOSIS — Y9289 Other specified places as the place of occurrence of the external cause: Secondary | ICD-10-CM | POA: Insufficient documentation

## 2016-06-29 DIAGNOSIS — Y9389 Activity, other specified: Secondary | ICD-10-CM | POA: Diagnosis not present

## 2016-06-29 DIAGNOSIS — S0990XA Unspecified injury of head, initial encounter: Secondary | ICD-10-CM | POA: Diagnosis not present

## 2016-06-29 DIAGNOSIS — I10 Essential (primary) hypertension: Secondary | ICD-10-CM | POA: Insufficient documentation

## 2016-06-29 DIAGNOSIS — Z79899 Other long term (current) drug therapy: Secondary | ICD-10-CM | POA: Insufficient documentation

## 2016-06-29 DIAGNOSIS — Y999 Unspecified external cause status: Secondary | ICD-10-CM | POA: Insufficient documentation

## 2016-06-29 DIAGNOSIS — W19XXXA Unspecified fall, initial encounter: Secondary | ICD-10-CM

## 2016-06-29 NOTE — ED Triage Notes (Signed)
Pt fell this PM x 45 min onto her left side. Presents with left thumb and wrist pain. Pt also hit her head denies LOC alert and oriented

## 2016-06-29 NOTE — ED Notes (Signed)
MD at bedside. 

## 2016-06-29 NOTE — ED Notes (Signed)
Pt slipped and fell on some water in her hallway about an hour prior to arrival.  She was carrying a load of towels and tried to brace herself with her left hand.  She hit her left hand on the floor and then the left side of her head.  She denies LOC, but states she "saw stars."  Pt has slight swelling to left wrist with tenderness to wrist and elbow.  Full ROM and distal pulses present.

## 2016-06-29 NOTE — ED Provider Notes (Signed)
[facility]   CSN: [id number] Arrival date & time: 06/29/16  4098 By signing my name below, I, Levon Hedger, attest that this documentation has been prepared under the direction and in the presence of No att. providers found . Electronically Signed: Levon Hedger, Scribe. 06/29/2016. 8:04 PM.   History   Chief Complaint Chief Complaint  Patient presents with  . Fall    HPI Jacqueline Morales is a 65 y.o. female who presents to the Emergency Department complaining of sudden onset, improving left thumb and wrist pain s/p fall tonight. Pt states she had just taken a shower when she slipped and fell, "hitting the deck." She states she braced with her her left arm, and fell on her left side. Her pain is worsened with movement. Per pt, she hit her head when she fell and "saw stars". She notes associated headache to the area. Pt was seen in the ED six days ago for nausea, vomiting, and diarrhea. Pt did not start her antibiotics and states that her symptoms have improved. Pt denies any LOC, chest pain, SOB, nausea, vomiting, or one sided weakness.   The history is provided by the patient. No language interpreter was used.   Past Medical History:  Diagnosis Date  . Abdominal spasms   . 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 Fibromyalgia   . GERD (gastroesophageal reflux disease)   . Hypertension     There are no active problems to display for this patient.   Past Surgical History:  Procedure Laterality Date  . CHOLECYSTECTOMY      OB History    No data available     Home Medications    Prior to Admission medications   Medication Sig Start Date End Date Taking? Authorizing Provider  cephALEXin (KEFLEX) 500 MG capsule Take 2 capsules (1,000 mg total) by mouth 2 (two) times daily. 06/23/16   Arby Barrette, MD  clorazepate (TRANXENE) 3.75 MG tablet Take 3.75 mg by mouth daily.    Historical Provider, MD  FLUoxetine (PROZAC) 10  MG tablet Take 10 mg by mouth daily.    Historical Provider, MD  loperamide (IMODIUM) 2 MG capsule Take 1 capsule (2 mg total) by mouth 4 (four) times daily as needed for diarrhea or loose stools. 05/11/16   Lavera Guise, MD  nebivolol (BYSTOLIC) 5 MG tablet Take 5 mg by mouth daily.    Historical Provider, MD  ondansetron (ZOFRAN ODT) 4 MG disintegrating tablet Take 1 tablet (4 mg total) by mouth every 4 (four) hours as needed for nausea or vomiting. 06/23/16   Arby Barrette, MD  ondansetron (ZOFRAN) 4 MG tablet Take 1 tablet (4 mg total) by mouth every 6 (six) hours. 05/11/16   Lavera Guise, MD    Family History History reviewed. No pertinent family history.  Social History Social History  Substance Use Topics  . Smoking status: Never Smoker  . Smokeless tobacco: Never Used  . Alcohol use No   Allergies   Abilify [aripiprazole]; Sulfur; Penicillins; and Sudafed [pseudoephedrine hcl]  Review of Systems Review of Systems  Constitutional: Negative for fever.  HENT: Negative for sore throat.   Eyes: Negative for visual disturbance.  Respiratory: Negative for cough and shortness of breath.   Cardiovascular: Negative for chest pain.  Gastrointestinal: Positive for nausea. Negative for abdominal pain.  Genitourinary: Negative for difficulty urinating.  Musculoskeletal: Positive for arthralgias. Negative for back pain and neck pain.  Skin:  Negative for rash.  Neurological: Positive for headaches. Negative for dizziness, syncope, facial asymmetry, weakness and numbness.  All other systems reviewed and are negative.  Physical Exam Updated Vital Signs BP 140/68 (BP Location: Left Arm)   Pulse 80   Temp 98.3 F (36.8 C) (Oral)   Resp 18   Ht 5\' 6"  (1.676 m)   Wt 158 lb (71.7 kg)   SpO2 100%   BMI 25.50 kg/m   Physical Exam  Constitutional: She is oriented to person, place, and time. She appears well-developed and well-nourished. No distress.  HENT:  Head: Normocephalic and  atraumatic.  Mouth/Throat: No oropharyngeal exudate.  Eyes: Conjunctivae and EOM are normal. Pupils are equal, round, and reactive to light.  Neck: Normal range of motion.  Cardiovascular: Normal rate, regular rhythm, normal heart sounds and intact distal pulses.  Exam reveals no gallop and no friction rub.   No murmur heard. Pulmonary/Chest: Effort normal and breath sounds normal. No respiratory distress. She has no wheezes. She has no rales.  Abdominal: Soft. She exhibits no distension. There is no tenderness. There is no guarding.  Musculoskeletal: She exhibits no edema.       Right hand: She exhibits tenderness (palmar,radial side of hand). She exhibits normal range of motion, normal capillary refill, no deformity and no laceration. Normal sensation noted. Decreased sensation is not present in the ulnar distribution, is not present in the medial distribution and is not present in the radial distribution.  Contusion to base of hand radial side No snuff box tenderness   Neurological: She is alert and oriented to person, place, and time. She has normal strength. No cranial nerve deficit or sensory deficit. Coordination normal. GCS eye subscore is 4. GCS verbal subscore is 5. GCS motor subscore is 6.  Skin: Skin is warm and dry. No rash noted. She is not diaphoretic. No erythema.  Contusion to the base of her thumb on the palmar side  Psychiatric: She has a normal mood and affect.  Nursing note and vitals reviewed.   ED Treatments / Results  DIAGNOSTIC STUDIES:  Oxygen Saturation is 100% on RA, normal by my interpretation.    COORDINATION OF CARE:  8:06 PM Will order CT head and DG wrist. Discussed treatment plan with pt at bedside and pt agreed to plan.   Labs (all labs ordered are listed, but only abnormal results are displayed) Labs Reviewed - No data to display  EKG  EKG Interpretation None      Radiology Dg Wrist Complete Left  Result Date: 06/29/2016 CLINICAL DATA:   Left wrist pain after fall tonight. EXAM: LEFT WRIST - COMPLETE 3+ VIEW COMPARISON:  Radiographs of July 19, 2012. FINDINGS: There is no evidence of fracture or dislocation. There is no evidence of arthropathy or other focal bone abnormality. Soft tissues are unremarkable. IMPRESSION: Normal left wrist. Electronically Signed   By: Lupita RaiderJames  Green Jr, M.D.   On: 06/29/2016 21:13   Ct Head Wo Contrast  Result Date: 06/29/2016 CLINICAL DATA:  Fall, hit left side of head. EXAM: CT HEAD WITHOUT CONTRAST TECHNIQUE: Contiguous axial images were obtained from the base of the skull through the vertex without intravenous contrast. COMPARISON:  None. FINDINGS: Brain: Ventricles are normal in size and configuration. All areas of the brain demonstrate normal gray-white matter attenuation. There is no mass, hemorrhage, edema or other evidence of acute parenchymal abnormality. No extra-axial hemorrhage. Vascular: No hyperdense vessel or unexpected calcification. Skull: Normal. Negative for fracture or focal lesion.  Sinuses/Orbits: No acute finding. Other: None. IMPRESSION: Negative head CT. No intracranial hemorrhage or edema. No skull fracture. Electronically Signed   By: Bary Richard M.D.   On: 06/29/2016 21:02    Procedures Procedures (including critical care time)  Medications Ordered in ED Medications - No data to display  Initial Impression / Assessment and Plan / ED Course  I have reviewed the triage vital signs and the nursing notes.  Pertinent labs & imaging results that were available during my care of the patient were reviewed by me and considered in my medical decision making (see chart for details).  Clinical Course   65yo female presents with mechanical fall.  XR left wrist without signs of trauma. No snuff box tenderness and rapidly improving pain and overall low suspicion for scaphoid fracture, however given location of pain also base of hand on radial side will provide thumb spica. Discussed  that if pain continues to rapidly improve again have low suspicion for occult fx, however if pain continues may wear splint and follow up with hand surgery. Head CT shows no sign of acute bleed. Patient discharged in stable condition with understanding of reasons to return.   Final Clinical Impressions(s) / ED Diagnoses   Final diagnoses:  Fall, initial encounter  Contusion of left hand, initial encounter, cannot rule out scaphoid fracture    New Prescriptions Discharge Medication List as of 06/29/2016  9:43 PM    I personally performed the services described in this documentation, which was scribed in my presence. The recorded information has been reviewed and is accurate.    Alvira Monday, MD 06/30/16 1311
